# Patient Record
Sex: Male | Born: 1970 | Race: White | Hispanic: No | Marital: Single | Smoking: Current every day smoker
Health system: Southern US, Community
[De-identification: ages and names within clinical notes are randomized; demographics above are authoritative.]

## PROBLEM LIST (undated history)

## (undated) ENCOUNTER — Ambulatory Visit (HOSPITAL_COMMUNITY): Admission: EM | Discharge status: Absent without leave | Payer: Self-pay

---

## 1999-04-01 ENCOUNTER — Emergency Department (HOSPITAL_COMMUNITY): Admission: EM | Admit: 1999-04-01 | Discharge: 1999-04-01 | Payer: Self-pay | Admitting: Emergency Medicine

## 2007-01-12 ENCOUNTER — Observation Stay (HOSPITAL_COMMUNITY): Admission: AC | Admit: 2007-01-12 | Discharge: 2007-01-13 | Payer: Self-pay

## 2009-06-02 ENCOUNTER — Emergency Department (HOSPITAL_COMMUNITY): Admission: EM | Admit: 2009-06-02 | Discharge: 2009-06-02 | Payer: Self-pay | Admitting: Emergency Medicine

## 2010-03-24 ENCOUNTER — Emergency Department (HOSPITAL_COMMUNITY)
Admission: EM | Admit: 2010-03-24 | Discharge: 2010-03-24 | Payer: Self-pay | Source: Home / Self Care | Admitting: Emergency Medicine

## 2010-06-21 LAB — URINE CULTURE
Colony Count: NO GROWTH
Culture: NO GROWTH

## 2010-06-21 LAB — DIFFERENTIAL
Basophils Absolute: 0.1 10*3/uL (ref 0.0–0.1)
Basophils Relative: 1 % (ref 0–1)
Eosinophils Absolute: 0.9 10*3/uL — ABNORMAL HIGH (ref 0.0–0.7)
Monocytes Relative: 7 % (ref 3–12)
Neutro Abs: 4.9 10*3/uL (ref 1.7–7.7)

## 2010-06-21 LAB — POCT I-STAT, CHEM 8
BUN: 14 mg/dL (ref 6–23)
Calcium, Ion: 1.11 mmol/L — ABNORMAL LOW (ref 1.12–1.32)
Chloride: 106 mEq/L (ref 96–112)
Potassium: 3.7 mEq/L (ref 3.5–5.1)
Sodium: 139 mEq/L (ref 135–145)

## 2010-06-21 LAB — CBC
HCT: 41 % (ref 39.0–52.0)
Hemoglobin: 14.1 g/dL (ref 13.0–17.0)
MCHC: 34.3 g/dL (ref 30.0–36.0)
MCV: 96.3 fL (ref 78.0–100.0)
Platelets: 248 10*3/uL (ref 150–400)
RBC: 4.26 MIL/uL (ref 4.22–5.81)
RDW: 13.6 % (ref 11.5–15.5)

## 2010-06-21 LAB — URINALYSIS, ROUTINE W REFLEX MICROSCOPIC
Glucose, UA: NEGATIVE mg/dL
Specific Gravity, Urine: 1.009 (ref 1.005–1.030)
pH: 7 (ref 5.0–8.0)
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
## 2010-12-07 ENCOUNTER — Emergency Department (HOSPITAL_COMMUNITY)
Admission: EM | Admit: 2010-12-07 | Discharge: 2010-12-07 | Disposition: A | Discharge status: Home / Self Care | Payer: Self-pay | Attending: Emergency Medicine | Admitting: Emergency Medicine

## 2010-12-07 DIAGNOSIS — M25519 Pain in unspecified shoulder: Secondary | ICD-10-CM | POA: Insufficient documentation

## 2011-01-05 LAB — CBC
HCT: 35 — ABNORMAL LOW
HCT: 42.4
Hemoglobin: 14.6
MCHC: 34.4
MCHC: 34.5
MCV: 95.6
Platelets: 202
RDW: 13.8

## 2011-01-05 LAB — I-STAT 8, (EC8 V) (CONVERTED LAB)
Chloride: 107
Glucose, Bld: 94
HCT: 47
Hemoglobin: 16
Operator id: 288331
Sodium: 142
pCO2, Ven: 39.1 — ABNORMAL LOW
pH, Ven: 7.386 — ABNORMAL HIGH

## 2011-01-05 LAB — SAMPLE TO BLOOD BANK

## 2011-01-05 LAB — POCT I-STAT CREATININE: Operator id: 288331

## 2011-01-05 LAB — PROTIME-INR: INR: 0.9

## 2013-11-14 ENCOUNTER — Emergency Department (HOSPITAL_BASED_OUTPATIENT_CLINIC_OR_DEPARTMENT_OTHER)
Admission: EM | Admit: 2013-11-14 | Discharge: 2013-11-14 | Disposition: A | Discharge status: Home / Self Care | Payer: Self-pay | Attending: Emergency Medicine | Admitting: Emergency Medicine

## 2013-11-14 ENCOUNTER — Encounter (HOSPITAL_BASED_OUTPATIENT_CLINIC_OR_DEPARTMENT_OTHER): Payer: Self-pay | Admitting: Emergency Medicine

## 2013-11-14 DIAGNOSIS — R21 Rash and other nonspecific skin eruption: Secondary | ICD-10-CM | POA: Insufficient documentation

## 2013-11-14 DIAGNOSIS — F17200 Nicotine dependence, unspecified, uncomplicated: Secondary | ICD-10-CM | POA: Insufficient documentation

## 2013-11-14 MED ORDER — DIPHENHYDRAMINE HCL 25 MG PO CAPS
25.0000 mg | ORAL_CAPSULE | Freq: Once | ORAL | Status: AC
  Administered 2013-11-14: 25 mg via ORAL
  Filled 2013-11-14: qty 1

## 2013-11-14 MED ORDER — PREDNISONE 10 MG PO TABS: 20.0000 mg | ORAL_TABLET | Freq: Every day | ORAL | Status: DC

## 2013-11-14 MED ORDER — PREDNISONE 50 MG PO TABS
60.0000 mg | ORAL_TABLET | Freq: Once | ORAL | Status: AC
  Administered 2013-11-14: 60 mg via ORAL
  Filled 2013-11-14 (×2): qty 1

## 2013-11-14 NOTE — Discharge Instructions (Signed)

## 2013-11-14 NOTE — ED Provider Notes (Signed)
CSN: [id number]     Arrival date & time 11/14/13  1024 History   First MD Initiated Contact with Patient 11/14/13 1107     Chief Complaint  Patient presents with  . 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   Patient is a 43 y.o. male presenting with rash. The history is provided by the patient.  Rash Location:  Leg Leg rash location:  L upper leg, R upper leg, L lower leg and R lower leg Quality: itchiness   Onset quality:  Gradual Timing:  Constant Progression:  Spreading Chronicity:  New Context: not animal contact, not chemical exposure, not diapers, not eggs, not exposure to similar rash, not hot tub use, not insect bite/sting, not medications, not new detergent/soap and not nuts   Relieved by:  Nothing Associated symptoms: no abdominal pain, no diarrhea, no fatigue, no fever, no headaches, no induration, no joint pain, no myalgias, no nausea, no periorbital edema, no shortness of breath, no sore throat, no throat swelling, no tongue swelling, no URI, not vomiting and not wheezing     History reviewed. No pertinent past medical history. History reviewed. No pertinent past surgical history. History reviewed. No pertinent family history. History  Substance Use Topics  . Smoking status: Current Every Day Smoker  . Smokeless tobacco: Not on file  . Alcohol Use: No    Review of Systems  Constitutional: Negative for fever and fatigue.  HENT: Negative for sore throat.   Respiratory: Negative for shortness of breath and wheezing.   Gastrointestinal: Negative for nausea, vomiting, abdominal pain and diarrhea.  Musculoskeletal: Negative for arthralgias and myalgias.  Skin: Positive for rash.  Neurological: Negative for headaches.      Allergies  Review of patient's allergies indicates no known allergies.  Home Medications   Prior to Admission  medications   Not on File   BP 106/87  Pulse 86  Temp(Src) 98.5 F (36.9 C) (Oral)  Resp 16  Ht 5\' 6"  (1.676 m)  Wt 140 lb (63.504 kg)  BMI 22.61 kg/m2  SpO2 100% Physical Exam  Nursing note and vitals reviewed. Constitutional: He is oriented to person, place, and time. He appears well-developed and well-nourished.  HENT:  Head: Normocephalic and atraumatic.  Right Ear: External ear normal.  Left Ear: External ear normal.  Nose: Nose normal.  Mouth/Throat: Oropharynx is clear and moist.  Eyes: Conjunctivae and EOM are normal. Pupils are equal, round, and reactive to light.  Neck: Normal range of motion. Neck supple.  Cardiovascular: Normal rate, regular rhythm, normal heart sounds and intact distal pulses.   Pulmonary/Chest: Effort normal and breath sounds normal. No respiratory distress. He has no wheezes. He exhibits no tenderness.  Abdominal: Soft. Bowel sounds are normal. He exhibits no distension and no mass. There is no tenderness. There is no guarding.  Musculoskeletal: Normal range of motion.  Neurological: He is alert and oriented to person, place, and time. He has normal reflexes. He exhibits normal muscle tone. Coordination normal.  Skin: Skin is warm and dry. Rash noted. Rash is macular.  Macules c.w. Insects bites bilateral lower extremities, on on penile shaft, scattered lower abdomen.   Psychiatric: He has a normal mood and affect. His behavior is normal. Judgment and thought content normal.    ED Course  Procedures (including critical care time) Labs Review Labs Reviewed - No data to display  Imaging Review No results found.   EKG Interpretation  None      MDM   Final diagnoses:  Rash and nonspecific skin eruption    Pruritic rash most consistent with insect bite.  Plan benadryl and prednisone.  Patient advised of return precautions and voices understanding.     Hilario Quarry, MD 11/14/13 [phone]

## 2013-11-14 NOTE — ED Notes (Signed)
Patient has itching rash on groin, legs which has grown worse over the past two days. Has been using calamine lotion but no relief

## 2016-03-18 ENCOUNTER — Encounter (HOSPITAL_COMMUNITY): Payer: Self-pay | Admitting: Emergency Medicine

## 2016-03-18 ENCOUNTER — Emergency Department (HOSPITAL_COMMUNITY)
Admission: EM | Admit: 2016-03-18 | Discharge: 2016-03-18 | Discharge status: Against Medical Advice | Payer: Self-pay | Attending: Emergency Medicine | Admitting: Emergency Medicine

## 2016-03-18 DIAGNOSIS — R112 Nausea with vomiting, unspecified: Secondary | ICD-10-CM

## 2016-03-18 DIAGNOSIS — R197 Diarrhea, unspecified: Secondary | ICD-10-CM | POA: Insufficient documentation

## 2016-03-18 DIAGNOSIS — N179 Acute kidney failure, unspecified: Secondary | ICD-10-CM | POA: Insufficient documentation

## 2016-03-18 DIAGNOSIS — F17200 Nicotine dependence, unspecified, uncomplicated: Secondary | ICD-10-CM | POA: Insufficient documentation

## 2016-03-18 LAB — CBC
HCT: 45.5 % (ref 39.0–52.0)
Hemoglobin: 15.8 g/dL (ref 13.0–17.0)
MCH: 32.5 pg (ref 26.0–34.0)
MCHC: 34.7 g/dL (ref 30.0–36.0)
MCV: 93.6 fL (ref 78.0–100.0)
PLATELETS: 280 10*3/uL (ref 150–400)
RBC: 4.86 MIL/uL (ref 4.22–5.81)
RDW: 13.1 % (ref 11.5–15.5)
WBC: 17.4 10*3/uL — AB (ref 4.0–10.5)

## 2016-03-18 LAB — URINALYSIS, ROUTINE W REFLEX MICROSCOPIC
Bacteria, UA: NONE SEEN
Glucose, UA: NEGATIVE mg/dL
Ketones, ur: NEGATIVE mg/dL
LEUKOCYTES UA: NEGATIVE
Nitrite: NEGATIVE
PH: 5 (ref 5.0–8.0)
Protein, ur: 100 mg/dL — AB
SPECIFIC GRAVITY, URINE: 1.027 (ref 1.005–1.030)

## 2016-03-18 LAB — COMPREHENSIVE METABOLIC PANEL
ALK PHOS: 64 U/L (ref 38–126)
ALT: 14 U/L — AB (ref 17–63)
AST: 21 U/L (ref 15–41)
Albumin: 5.5 g/dL — ABNORMAL HIGH (ref 3.5–5.0)
Anion gap: 13 (ref 5–15)
BUN: 25 mg/dL — AB (ref 6–20)
CALCIUM: 10.5 mg/dL — AB (ref 8.9–10.3)
CO2: 21 mmol/L — ABNORMAL LOW (ref 22–32)
CREATININE: 1.82 mg/dL — AB (ref 0.61–1.24)
Chloride: 106 mmol/L (ref 101–111)
GFR, EST AFRICAN AMERICAN: 51 mL/min — AB (ref >60)
GFR, EST NON AFRICAN AMERICAN: 44 mL/min — AB (ref >60)
Glucose, Bld: 135 mg/dL — ABNORMAL HIGH (ref 65–99)
Potassium: 3.8 mmol/L (ref 3.5–5.1)
Sodium: 140 mmol/L (ref 135–145)
Total Bilirubin: 0.6 mg/dL (ref 0.3–1.2)
Total Protein: 9 g/dL — ABNORMAL HIGH (ref 6.5–8.1)

## 2016-03-18 LAB — LIPASE, BLOOD: Lipase: 16 U/L (ref 11–51)

## 2016-03-18 MED ORDER — SODIUM CHLORIDE 0.9 % IV BOLUS (SEPSIS)
1000.0000 mL | Freq: Once | INTRAVENOUS | Status: AC
  Administered 2016-03-18: 1000 mL via INTRAVENOUS

## 2016-03-18 MED ORDER — SODIUM CHLORIDE 0.9 % IV BOLUS (SEPSIS)
1000.0000 mL | Freq: Once | INTRAVENOUS | Status: AC
Start: 2016-03-18 — End: 2016-03-18
  Administered 2016-03-18: 1000 mL via INTRAVENOUS

## 2016-03-18 NOTE — ED Notes (Signed)
Pt left before being discharged.

## 2016-03-18 NOTE — ED Triage Notes (Signed)
Per EMS. Pt from home. Reports n/v/d since 0400 this am. Multiple family members have similar symptoms. Given 4mg  zofran by EMS

## 2016-03-18 NOTE — ED Provider Notes (Signed)
[facility]-EMERGENCY DEPT Provider Note   CSN: [id number] Arrival date & time: 03/18/16  1444     History   Chief Complaint Chief Complaint  Patient presents with  . Emesis  . Diarrhea  . Abdominal Pain    HPI Terry Norris is a 45 y.o. male.  The history is provided by the patient.  Emesis   This is a new problem. The current episode started 12 to 24 hours ago. The problem occurs continuously. The problem has not changed since onset.There has been no fever. Associated symptoms include abdominal pain (diffuse, epigastric, and right lower abdomen). Risk factors include ill contacts (similar symptoms in family members).    History reviewed. No pertinent past medical history.  There are no active problems to display for this patient.   History reviewed. No pertinent surgical history.     Home Medications    Prior to Admission medications   Not on File    Family History History reviewed. No pertinent family history.  Social History Social History  Substance Use Topics  . Smoking status: Current Every Day Smoker  . Smokeless tobacco: Not on file  . Alcohol use No     Allergies   Patient has no known allergies.   Review of Systems Review of Systems  Gastrointestinal: Positive for abdominal pain (diffuse, epigastric, and right lower abdomen) and vomiting.  All other systems reviewed and are negative.    Physical Exam Updated Vital Signs BP 117/80 (BP Location: Left Arm)   Pulse 62   Temp 98.7 F (37.1 C)   Resp 18   SpO2 98%   Physical Exam  Constitutional: He is oriented to person, place, and time. He appears well-developed and well-nourished. No distress.  HENT:  Head: Normocephalic and atraumatic.  Nose: Nose normal.  Eyes: Conjunctivae are normal.  Neck: Neck supple. No tracheal deviation present.  Cardiovascular: Normal rate, regular rhythm and normal heart sounds.   Pulmonary/Chest: Effort normal and breath sounds normal. No respiratory  distress.  Abdominal: Soft. He exhibits no distension. There is no tenderness. There is no rebound and no guarding.  Neurological: He is alert and oriented to person, place, and time.  Skin: Skin is warm and dry.  Psychiatric: He has a normal mood and affect.  Vitals reviewed.    ED Treatments / Results  Labs (all labs ordered are listed, but only abnormal results are displayed) Labs Reviewed  COMPREHENSIVE METABOLIC PANEL - Abnormal; Notable for the following:       Result Value   CO2 21 (*)    Glucose, Bld 135 (*)    BUN 25 (*)    Creatinine, Ser 1.82 (*)    Calcium 10.5 (*)    Total Protein 9.0 (*)    Albumin 5.5 (*)    ALT 14 (*)    GFR calc non Af Amer 44 (*)    GFR calc Af Amer 51 (*)    All other components within normal limits  CBC - Abnormal; Notable for the following:    WBC 17.4 (*)    All other components within normal limits  URINALYSIS, ROUTINE W REFLEX MICROSCOPIC - Abnormal; Notable for the following:    Color, Urine AMBER (*)    APPearance HAZY (*)    Hgb urine dipstick MODERATE (*)    Bilirubin Urine SMALL (*)    Protein, ur 100 (*)    Squamous Epithelial / LPF 0-5 (*)    All other components within normal limits  LIPASE, BLOOD    EKG  EKG Interpretation None       Radiology No results found.  Procedures Procedures (including critical care time)  Medications Ordered in ED Medications  sodium chloride 0.9 % bolus 1,000 mL (0 mLs Intravenous Stopped 03/18/16 1949)  sodium chloride 0.9 % bolus 1,000 mL (0 mLs Intravenous Stopped 03/18/16 1950)     Initial Impression / Assessment and Plan / ED Course  I have reviewed the triage vital signs and the nursing notes.  Pertinent labs & imaging results that were available during my care of the patient were reviewed by me and considered in my medical decision making (see chart for details).  Clinical Course    45 y.o. male presents with N/V/D over last 24 hours. Last renal function available is  remote but suspect mild AKI 2/2 volume loss. Fluid resuscitated. No abdominal tenderness but has leukocytosis that may be due to volume contraction but plan for serial exams in the ED.   Patient was advised to come back in 48 hours for recheck of creatinine to monitor to resolution. He was given 2 L of IV fluid and after this completed he eloped unannounced before being able to be reexamined or be provided with discharge paperwork/prescriptions.    Final Clinical Impressions(s) / ED Diagnoses   Final diagnoses:  AKI (acute kidney injury) (HCC)  Nausea vomiting and diarrhea    New Prescriptions New Prescriptions   No medications on file     Lyndal Pulleyaniel Talley Kreiser, MD 03/19/16 [phone]

## 2016-03-18 NOTE — ED Triage Notes (Signed)
Nausea, vomiting, diarrhea, abdominal cramping starting last night. Other family members have been sick recently with the same thing. Pain 6/10, generalized abdominal pain. EMS started IV and gave zofran/fluids.

## 2017-04-05 ENCOUNTER — Emergency Department (HOSPITAL_COMMUNITY)
Admission: EM | Admit: 2017-04-05 | Discharge: 2017-04-05 | Discharge status: Against Medical Advice | Payer: Self-pay | Attending: Emergency Medicine | Admitting: Emergency Medicine

## 2017-04-05 ENCOUNTER — Encounter (HOSPITAL_COMMUNITY): Payer: Self-pay | Admitting: *Deleted

## 2017-04-05 DIAGNOSIS — Z5321 Procedure and treatment not carried out due to patient leaving prior to being seen by health care provider: Secondary | ICD-10-CM | POA: Insufficient documentation

## 2017-04-05 DIAGNOSIS — M549 Dorsalgia, unspecified: Secondary | ICD-10-CM | POA: Insufficient documentation

## 2017-04-05 NOTE — ED Notes (Signed)
Pt called 3 times no answer.

## 2017-04-05 NOTE — ED Triage Notes (Signed)
Per EMS, pt w/ hx chronic back pain which became worse last night after work. Pt states he can't deal with pain any longer. Pain radiates down right leg. Pt took aspirin for pain.

## 2017-04-05 NOTE — ED Notes (Signed)
Pt called to room no answer  

## 2019-08-07 ENCOUNTER — Emergency Department (HOSPITAL_COMMUNITY)
Admission: EM | Admit: 2019-08-07 | Discharge: 2019-08-07 | Disposition: A | Discharge status: Home / Self Care | Payer: Self-pay | Attending: Emergency Medicine | Admitting: Emergency Medicine

## 2019-08-07 ENCOUNTER — Emergency Department (HOSPITAL_COMMUNITY): Payer: Self-pay

## 2019-08-07 ENCOUNTER — Other Ambulatory Visit: Payer: Self-pay

## 2019-08-07 ENCOUNTER — Encounter (HOSPITAL_COMMUNITY): Payer: Self-pay | Admitting: Emergency Medicine

## 2019-08-07 DIAGNOSIS — R197 Diarrhea, unspecified: Secondary | ICD-10-CM | POA: Insufficient documentation

## 2019-08-07 DIAGNOSIS — F17210 Nicotine dependence, cigarettes, uncomplicated: Secondary | ICD-10-CM | POA: Insufficient documentation

## 2019-08-07 DIAGNOSIS — K625 Hemorrhage of anus and rectum: Secondary | ICD-10-CM | POA: Insufficient documentation

## 2019-08-07 DIAGNOSIS — R109 Unspecified abdominal pain: Secondary | ICD-10-CM | POA: Insufficient documentation

## 2019-08-07 LAB — CBC
HCT: 42.9 % (ref 39.0–52.0)
Hemoglobin: 14.2 g/dL (ref 13.0–17.0)
MCH: 32.2 pg (ref 26.0–34.0)
MCHC: 33.1 g/dL (ref 30.0–36.0)
MCV: 97.3 fL (ref 80.0–100.0)
Platelets: 276 10*3/uL (ref 150–400)
RBC: 4.41 MIL/uL (ref 4.22–5.81)
RDW: 12.7 % (ref 11.5–15.5)
WBC: 8.6 10*3/uL (ref 4.0–10.5)
nRBC: 0 % (ref 0.0–0.2)

## 2019-08-07 LAB — COMPREHENSIVE METABOLIC PANEL
ALT: 21 U/L (ref 0–44)
AST: 31 U/L (ref 15–41)
Albumin: 4.2 g/dL (ref 3.5–5.0)
Alkaline Phosphatase: 52 U/L (ref 38–126)
Anion gap: 8 (ref 5–15)
BUN: 18 mg/dL (ref 6–20)
CO2: 24 mmol/L (ref 22–32)
Calcium: 9.4 mg/dL (ref 8.9–10.3)
Chloride: 107 mmol/L (ref 98–111)
Creatinine, Ser: 1.22 mg/dL (ref 0.61–1.24)
GFR calc Af Amer: >60 mL/min (ref >60)
GFR calc non Af Amer: >60 mL/min (ref >60)
Glucose, Bld: 111 mg/dL — ABNORMAL HIGH (ref 70–99)
Potassium: 4.7 mmol/L (ref 3.5–5.1)
Sodium: 139 mmol/L (ref 135–145)
Total Bilirubin: 1.2 mg/dL (ref 0.3–1.2)
Total Protein: 6.8 g/dL (ref 6.5–8.1)

## 2019-08-07 LAB — TYPE AND SCREEN
ABO/RH(D): O POS
Antibody Screen: NEGATIVE

## 2019-08-07 LAB — ABO/RH: ABO/RH(D): O POS

## 2019-08-07 LAB — POC OCCULT BLOOD, ED: Fecal Occult Bld: NEGATIVE

## 2019-08-07 MED ORDER — IOHEXOL 300 MG/ML  SOLN
75.0000 mL | Freq: Once | INTRAMUSCULAR | Status: AC
  Administered 2019-08-07: 75 mL via INTRAVENOUS
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
## 2019-08-07 NOTE — ED Triage Notes (Signed)
C/o bright red rectal bleeding since Friday.  Denies pain.

## 2019-08-07 NOTE — Discharge Instructions (Addendum)
You were seen in the emergency department for rectal bleeding and diarrhea.  Your lab work and imaging was unremarkable.  There were no acute concerns that require admission for further work-up or stabilization.  You are stable for discharge and outpatient follow-up.  You will need to follow-up with a primary care doctor so they can refer you to a gastroenterologist for a colonoscopy. If bleeding or diarrhea were to worsen such that you become lightheaded, you pass out or are unable to go about your regular daily routine, please return to the emergency department.

## 2019-08-07 NOTE — Progress Notes (Signed)
Terry Norris [name], RN, BSN, [place] [phone]  RNCM set up appointment with Primary Care at [facility] on 6/14.  Spoke with pt at bedside and advised to please arrive 15 min early and take a picture ID and your current medications.  Pt verbalizes understanding of keeping appointment.

## 2019-08-30 ENCOUNTER — Encounter: Payer: Self-pay | Admitting: Internal Medicine

## 2019-09-09 ENCOUNTER — Telehealth: Payer: Self-pay | Admitting: Internal Medicine

## 2020-11-09 ENCOUNTER — Emergency Department (HOSPITAL_COMMUNITY)
Admission: EM | Admit: 2020-11-09 | Discharge: 2020-11-09 | Disposition: A | Discharge status: Home / Self Care | Payer: Self-pay | Attending: Physician Assistant | Admitting: Physician Assistant

## 2020-11-09 ENCOUNTER — Other Ambulatory Visit: Payer: Self-pay

## 2020-11-09 DIAGNOSIS — F17210 Nicotine dependence, cigarettes, uncomplicated: Secondary | ICD-10-CM | POA: Insufficient documentation

## 2020-11-09 DIAGNOSIS — K0889 Other specified disorders of teeth and supporting structures: Secondary | ICD-10-CM | POA: Insufficient documentation

## 2020-11-09 MED ORDER — AMOXICILLIN-POT CLAVULANATE 875-125 MG PO TABS: 1.0000 | ORAL_TABLET | Freq: Two times a day (BID) | ORAL | 0 refills | Status: AC

## 2020-11-09 MED ORDER — NAPROXEN 500 MG PO TABS: 500.0000 mg | ORAL_TABLET | Freq: Two times a day (BID) | ORAL | 0 refills | Status: AC

## 2020-11-09 NOTE — ED Provider Notes (Signed)
[facility] EMERGENCY DEPARTMENT Provider Note   CSN: [id number] Arrival date & time: 11/09/20  1310     History Chief Complaint  Patient presents with   Dental Pain    Terry Norris is a 50 y.o. male.  HPI  50 year old male presents the emergency department today for evaluation of left lower dental pain that has been ongoing for several days.  Has been trying to get into a dentist with no success.  There have been no reported fevers.  Pain is constant and severe in nature.  Is worse with action.  No past medical history on file.  There are no problems to display for this patient.   No past surgical history on file.     Family History  Problem Relation Age of Onset   Colon cancer Maternal Grandmother    Colon cancer Maternal Grandfather     Social History   Tobacco Use   Smoking status: Every Day    Types: Cigarettes   Smokeless tobacco: Never  Substance Use Topics   Alcohol use: No   Drug use: No    Home Medications Prior to Admission medications   Medication Sig Start Date End Date Taking? Authorizing Provider  amoxicillin-clavulanate (AUGMENTIN) 875-125 MG tablet Take 1 tablet by mouth 2 (two) times daily for 7 days. 11/09/20 11/16/20 Yes Corban Kistler S, PA-C  naproxen (NAPROSYN) 500 MG tablet Take 1 tablet (500 mg total) by mouth 2 (two) times daily for 7 days. 11/09/20 11/16/20 Yes Pasquale Matters S, PA-C    Allergies    Patient has no known allergies.  Review of Systems   Review of Systems  Constitutional:  Negative for fever.  HENT:  Positive for dental problem.    Physical Exam Updated Vital Signs BP 116/72 (BP Location: Right Arm)   Pulse (!) 57   Temp 98.6 F (37 C) (Oral)   Resp 14   SpO2 100%   Physical Exam Constitutional:      General: He is not in acute distress.    Appearance: He is well-developed.  HENT:     Mouth/Throat:     Comments: Poor dentition throughout. Ttp to the left incisor and left lower  molars. No periapical abscess or trismus Eyes:     Conjunctiva/sclera: Conjunctivae normal.  Cardiovascular:     Rate and Rhythm: Normal rate.  Pulmonary:     Effort: Pulmonary effort is normal.  Skin:    General: Skin is warm and dry.  Neurological:     Mental Status: He is alert and oriented to person, place, and time.    ED Results / Procedures / Treatments   Labs (all labs ordered are listed, but only abnormal results are displayed) Labs Reviewed - No data to display  EKG None  Radiology No results found.  Procedures Procedures   Medications Ordered in ED Medications - No data to display  ED Course  I have reviewed the triage vital signs and the nursing notes.  Pertinent labs & imaging results that were available during my care of the patient were reviewed by me and considered in my medical decision making (see chart for details).    MDM Rules/Calculators/A&P                          Patient with toothache.  No gross abscess.  Exam unconcerning for Ludwig's angina or spread of infection.  Will treat with augmentin  and pain medicine.  Urged patient to follow-up with dentist.      Final Clinical Impression(s) / ED Diagnoses Final diagnoses:  Pain, dental    Rx / DC Orders ED Discharge Orders          Ordered    amoxicillin-clavulanate (AUGMENTIN) 875-125 MG tablet  2 times daily        11/09/20 1401    naproxen (NAPROSYN) 500 MG tablet  2 times daily        11/09/20 [address], Saphire Barnhart S, PA-C 11/09/20 1401    Horton, Clabe Seal, DO 11/10/20 1510

## 2020-11-09 NOTE — ED Triage Notes (Signed)
Pt reports dental pain x 2 weeks. Unable to see dentist yet.

## 2020-11-09 NOTE — Discharge Instructions (Addendum)
You were given a prescription for antibiotics. Please take the antibiotic prescription fully.   Please follow-up with a dentist in the next 5 to 7 days for reevaluation.  If you do not have a dentist, resources were provided for dentist in the area in your discharge summary.  Please contact one of the offices that are listed and make an appointment for follow-up.  Please return to the emergency department for any new or worsening symptoms.  

## 2021-06-17 IMAGING — CT CT ABD-PELV W/ CM
3 of 5 series · 16 of 46 positions shown, 18 images · IV contrast (omnipaque)
Comparison: CT abdomen and pelvis 06/02/2009.

CLINICAL DATA: Rectal bleeding since 08/02/2019.

EXAM:
CT ABDOMEN AND PELVIS WITH CONTRAST
TECHNIQUE: Multidetector CT imaging of the abdomen and pelvis was performed
using the standard protocol following bolus administration of
intravenous contrast.
CONTRAST:  75 mL OMNIPAQUE IOHEXOL 300 MG/ML  SOLN

[Series 3: abdomen 5.0 · axial · 0.66mm/px · z∈[+714,+1074]mm · 11 of 88 slices shown, 13 images]
[im 8/88  soft-tissue]
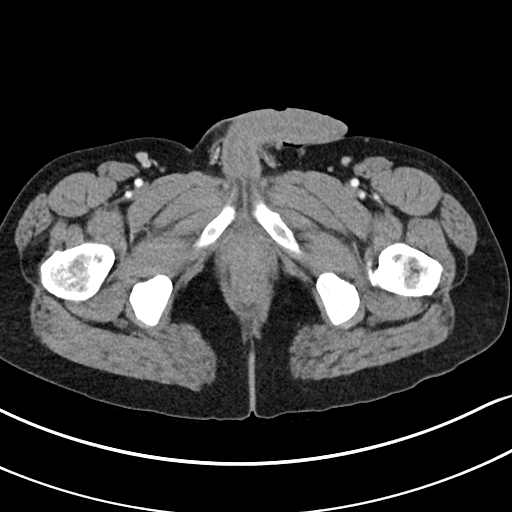
[im 8/88  bone]
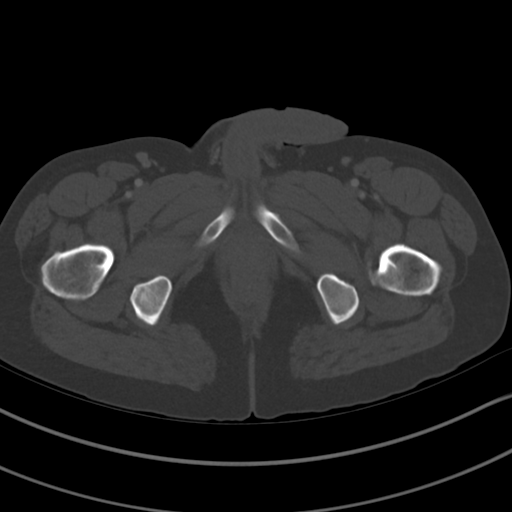
[im 15/88  soft-tissue]
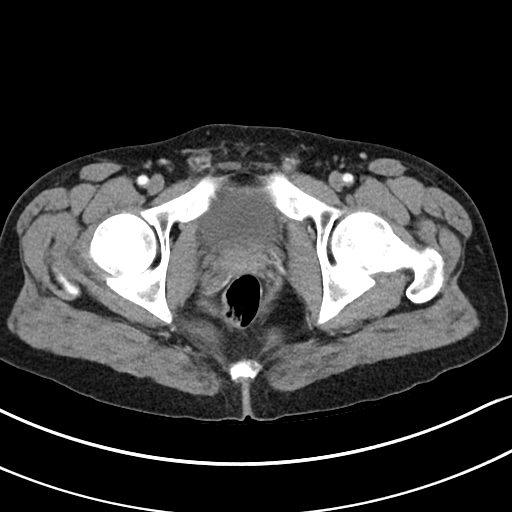
[im 22/88  soft-tissue]
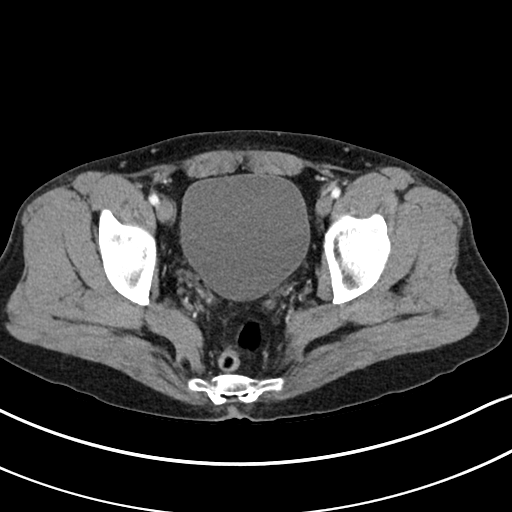
[im 30/88  soft-tissue]
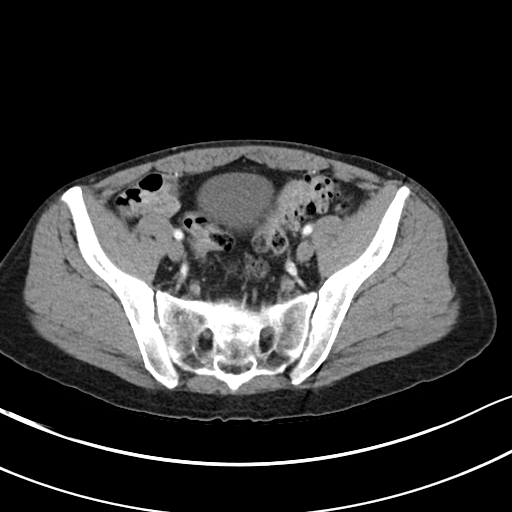
[im 37/88  soft-tissue]
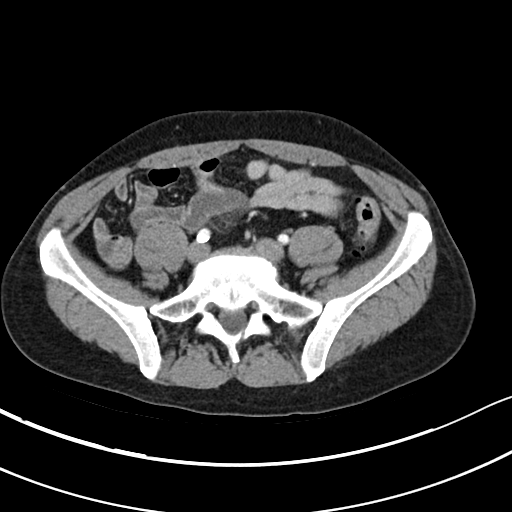
[im 44/88  soft-tissue]
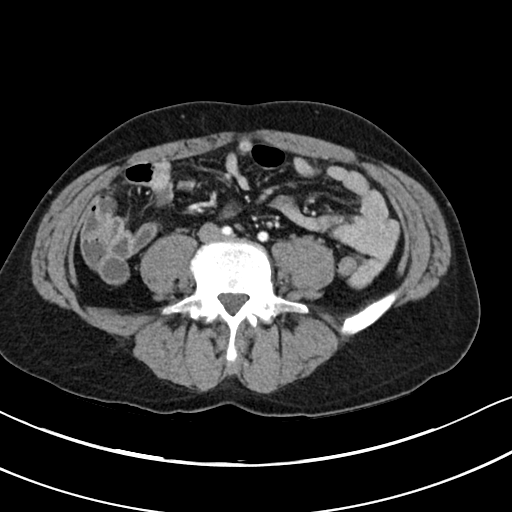
[im 51/88  soft-tissue]
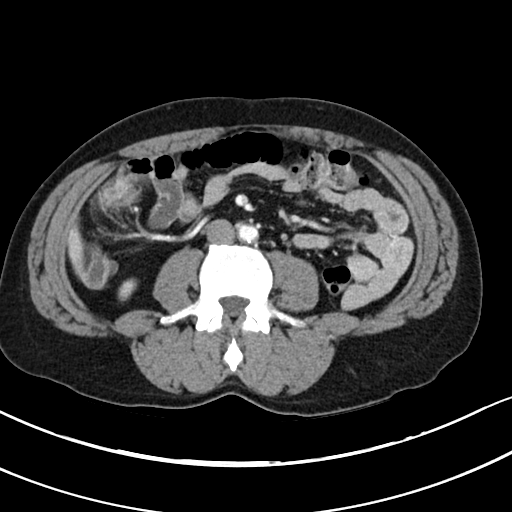
[im 59/88  soft-tissue]
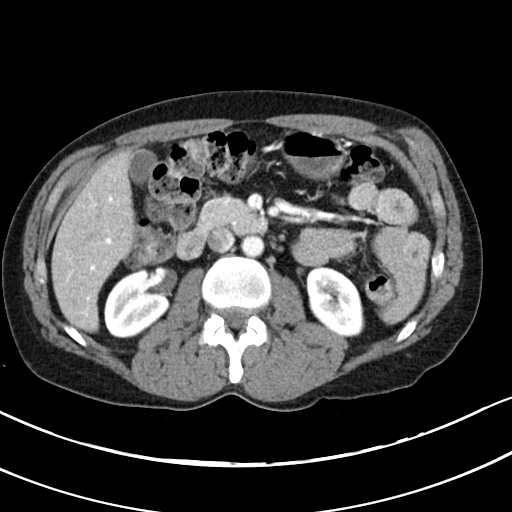
[im 66/88  soft-tissue]
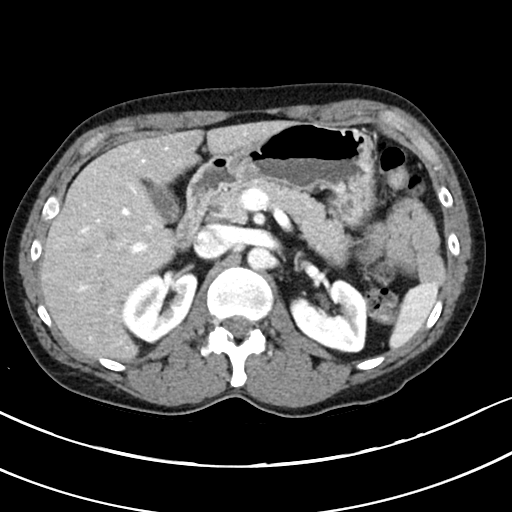
[im 66/88  bone]
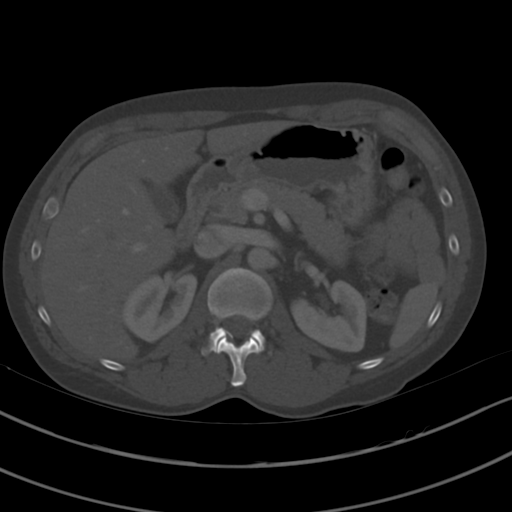
[im 73/88  soft-tissue]
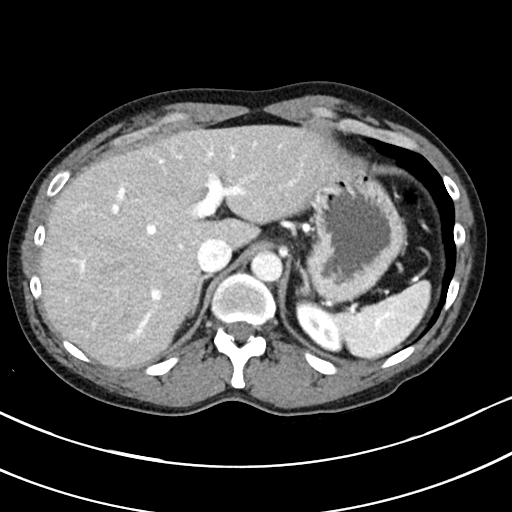
[im 80/88  soft-tissue]
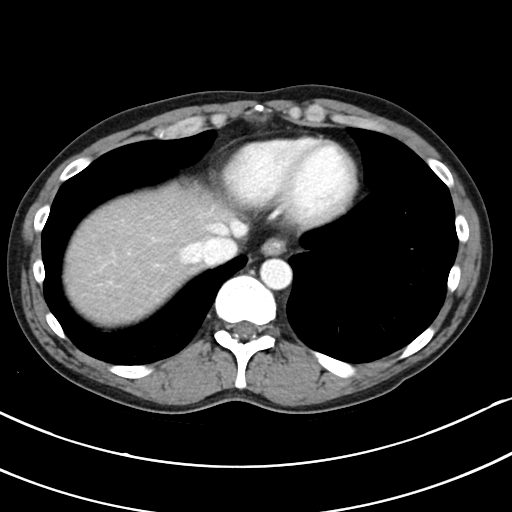

[Series 5: lung · axial · 0.66mm/px · z∈[+1028,+1046]mm · 2 of 52 slices shown]
[im 9/52  bone]
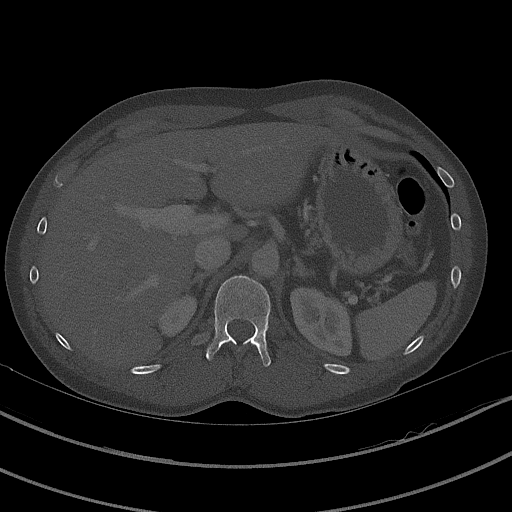
[im 18/52  bone]
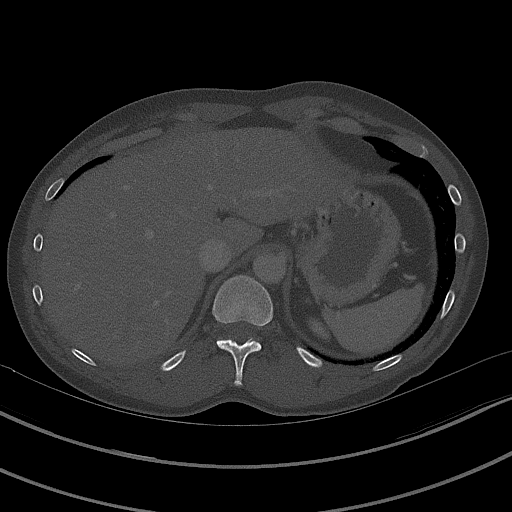

[Series 6: abdomen 3.0 mpr cor · coronal · 0.70mm/px · 3 of 77 slices shown]
[im 26/77  soft-tissue]
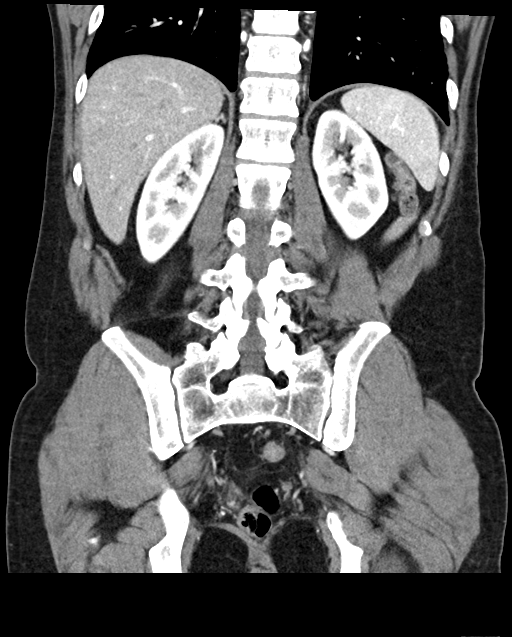
[im 34/77  soft-tissue]
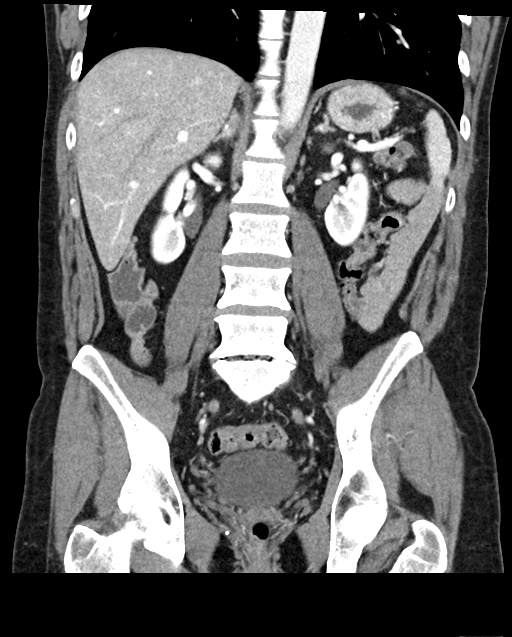
[im 43/77  soft-tissue]
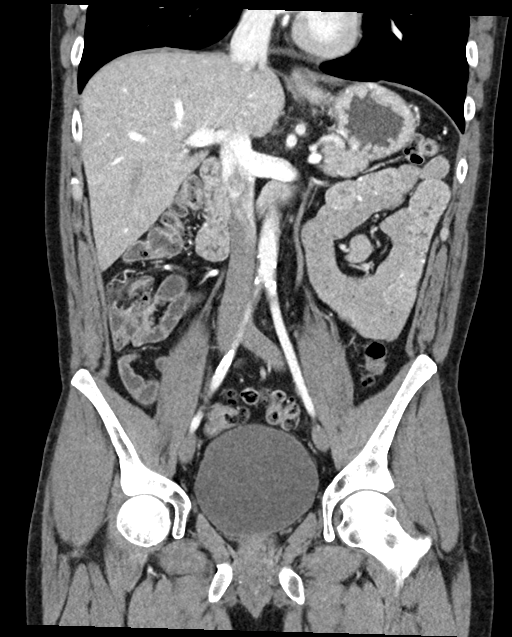

[16 of 46 positions shown; findings below may reference images not displayed]

FINDINGS: Lower chest: Mild dependent atelectasis. Lung bases otherwise clear.

Hepatobiliary: No focal liver abnormality is seen. No gallstones,
gallbladder wall thickening, or biliary dilatation. The liver is low
attenuating consistent with fatty infiltration.

Pancreas: Unremarkable. No pancreatic ductal dilatation or
surrounding inflammatory changes.

Spleen: Normal in size without focal abnormality.

Adrenals/Urinary Tract: Adrenal glands are unremarkable. Kidneys are
normal, without renal calculi, focal lesion, or hydronephrosis.
Bladder is unremarkable.

Stomach/Bowel: Stomach is within normal limits. Appendix appears
normal. No evidence of bowel wall thickening, distention, or
inflammatory changes. Diverticulosis without diverticulitis is most
extensive in the sigmoid colon.

Vascular/Lymphatic: Aortic atherosclerosis. No enlarged abdominal or
pelvic lymph nodes.

Reproductive: Prostate is unremarkable.

Other: Very small fat containing umbilical hernia.

Musculoskeletal: No acute or focal abnormality. Lower lumbar
degenerative disease noted.
IMPRESSION: No acute abnormality or finding to explain the patient's symptoms.

Fatty infiltration of the liver.

Diverticulosis without diverticulitis.

Aortic Atherosclerosis ([id number]).
